# Patient Record
Sex: Male | Born: 2000 | Hispanic: Yes | Marital: Single | Smoking: Never smoker
Health system: Southern US, Community
[De-identification: ages and names within clinical notes are randomized; demographics above are authoritative.]

## PROBLEM LIST (undated history)

## (undated) HISTORY — PX: APPENDECTOMY: SHX54

---
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
## 2023-01-29 ENCOUNTER — Emergency Department (HOSPITAL_BASED_OUTPATIENT_CLINIC_OR_DEPARTMENT_OTHER)
Admission: EM | Admit: 2023-01-29 | Discharge: 2023-01-29 | Disposition: A | Discharge status: Home / Self Care | Payer: Medicaid Other | Attending: Emergency Medicine | Admitting: Emergency Medicine

## 2023-01-29 ENCOUNTER — Encounter (HOSPITAL_BASED_OUTPATIENT_CLINIC_OR_DEPARTMENT_OTHER): Payer: Self-pay | Admitting: Emergency Medicine

## 2023-01-29 ENCOUNTER — Other Ambulatory Visit: Payer: Self-pay

## 2023-01-29 DIAGNOSIS — J45909 Unspecified asthma, uncomplicated: Secondary | ICD-10-CM | POA: Diagnosis not present

## 2023-01-29 DIAGNOSIS — N4889 Other specified disorders of penis: Secondary | ICD-10-CM | POA: Diagnosis present

## 2023-01-29 DIAGNOSIS — N481 Balanitis: Secondary | ICD-10-CM | POA: Diagnosis not present

## 2023-01-29 LAB — URINALYSIS, MICROSCOPIC (REFLEX)

## 2023-01-29 LAB — URINALYSIS, ROUTINE W REFLEX MICROSCOPIC
Bilirubin Urine: NEGATIVE
Glucose, UA: NEGATIVE mg/dL
Ketones, ur: NEGATIVE mg/dL
Leukocytes,Ua: NEGATIVE
Nitrite: NEGATIVE
Protein, ur: NEGATIVE mg/dL
Specific Gravity, Urine: >=1.03 (ref 1.005–1.030)
pH: 5.5 (ref 5.0–8.0)

## 2023-01-29 MED ORDER — HYDROCORTISONE 1 % EX CREA: TOPICAL_CREAM | CUTANEOUS | 0 refills | Status: AC

## 2023-01-29 NOTE — Discharge Instructions (Addendum)
Please take your medications as prescribed. Take tylenol/ibuprofen for pain. I recommend close follow-up with urology for reevaluation.  Please do not hesitate to return to emergency department if worrisome signs symptoms we discussed become apparent.

## 2023-01-29 NOTE — ED Provider Notes (Signed)
[place] EMERGENCY DEPARTMENT AT [facility] [place] Provider Note   CSN: [id number] Arrival date & time: 01/29/23  1053     History  No chief complaint on file.   Ethan Ware is a 22 y.o. male history of asthma presents today for evaluation of penile pain.  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  He denies any redness, itching.  States he is able to retract and return the foreskin to its normal position.  He denies any urinary symptoms, abnormal penile discharge, fever, nausea or vomiting.  Patient is sexually active.  HPI  Past Medical History:  Diagnosis Date   Asthma    Past Surgical History:  Procedure Laterality Date   APPENDECTOMY       Home Medications Prior to Admission medications   Medication Sig Start Date End Date Taking? Authorizing Provider  acetaminophen (TYLENOL) 100 MG/ML solution Take 200 mg by mouth every 4 (four) hours as needed. For pain and fever     [provider]  ibuprofen (ADVIL,MOTRIN) 100 MG/5ML suspension Take 200 mg by mouth every 6 (six) hours as needed. For pain and fever      [provider]  Pediatric Multivit-Minerals-C (GUMMI BEAR MULTIVITAMIN/MIN PO) Take 1 each by mouth daily.      [provider]  traMADol (ULTRAM) 50 MG tablet Take 1 tablet (50 mg total) by mouth every 6 (six) hours as needed. 09/27/21   Cristopher Peru, PA-C      Allergies    Penicillins    Review of Systems   Review of Systems  Physical Exam Updated Vital Signs BP (!) 121/98 (BP Location: Left Arm)   Pulse 72   Temp 98.3 F (36.8 C) (Oral)   Resp 16   Ht 5\' 6"  (1.676 m)   Wt 81.6 kg   SpO2 98%   BMI 29.05 kg/m  Physical Exam Vitals and nursing note reviewed.  Constitutional:      Appearance: Normal appearance.  HENT:     Head: Normocephalic and atraumatic.     Mouth/Throat:     Mouth: Mucous membranes are moist.  Eyes:      General: No scleral icterus. Cardiovascular:     Rate and Rhythm: Normal rate and regular rhythm.     Pulses: Normal pulses.     Heart sounds: Normal heart sounds.  Pulmonary:     Effort: Pulmonary effort is normal.     Breath sounds: Normal breath sounds.  Abdominal:     General: Abdomen is flat.     Palpations: Abdomen is soft.     Tenderness: There is no abdominal tenderness.  Genitourinary:    Comments: GU exam with Greg NT present throughout examination.  Foreskin is able to be retracted and returned to its normal position.  Noted some swelling to the vein at the tip of the penis.  No overlying skin erythema.  No evidence of fungal infection. Musculoskeletal:        General: No deformity.  Skin:    General: Skin is warm.     Findings: No rash.  Neurological:     General: No focal deficit present.     Mental Status: He is alert.  Psychiatric:        Mood and Affect: Mood normal.    ED Results / Procedures / Treatments   Labs (all labs ordered are listed, but only abnormal results  are displayed) Labs Reviewed  URINALYSIS, ROUTINE W REFLEX MICROSCOPIC - Abnormal; Notable for the following components:      Result Value   Hgb urine dipstick TRACE (*)    All other components within normal limits  URINALYSIS, MICROSCOPIC (REFLEX) - Abnormal; Notable for the following components:   Bacteria, UA RARE (*)    All other components within normal limits    EKG None  Radiology No results found.  Procedures Procedures    Medications Ordered in ED Medications - No data to display  ED Course/ Medical Decision Making/ A&P                                 Medical Decision Making Amount and/or Complexity of Data Reviewed Labs: ordered.   This patient presents to the ED for penile pain, this involves an extensive number of treatment options, and is a complaint that carries with a high risk of complications and morbidity.  The differential diagnosis includes pharyngitis,  phimosis, paraphimosis, fungal infection, bacterial infection, UTI, STI.  This is not an exhaustive list.  Lab tests: I ordered and personally interpreted labs.  The pertinent results include: UA is unremarkable.  Problem list/ ED course/ Critical interventions/ Medical management: HPI: See above Vital signs within normal range and stable throughout visit. Laboratory/imaging studies significant for: See above. On physical examination, patient is afebrile and appears in no acute distress.  GU exam with Tammy Sours NT present throughout examination.  Foreskin is able to be retracted and returned is normal position.  I noticed some swelling to the vein at the tip of penis, no overlying skin erythema, no evidence of fungal or bacterial infection.  Symptoms most consistent with balanitis.  Based on physical examination, I have low suspicion for phimosis or paraphimosis.  Will prescribe hydrocortisone cream to help reduce swelling.  Advised patient to follow-up with urology for reevaluation and management.  Strict precaution discussed. I have reviewed the patient home medicines and have made adjustments as needed.  Cardiac monitoring/EKG: The patient was maintained on a cardiac monitor.  I personally reviewed and interpreted the cardiac monitor which showed an underlying rhythm of: sinus rhythm.  Additional history obtained: External records from outside source obtained and reviewed including: Chart review including previous notes, labs, imaging.  Consultations obtained:  Disposition Continued outpatient therapy. Follow-up with PCP recommended for reevaluation of symptoms. Treatment plan discussed with patient.  Pt acknowledged understanding was agreeable to the plan. Worrisome signs and symptoms were discussed with patient, and patient acknowledged understanding to return to the ED if they noticed these signs and symptoms. Patient was stable upon discharge.   This chart was dictated using voice  recognition software.  Despite best efforts to proofread,  errors can occur which can change the documentation meaning.          Final Clinical Impression(s) / ED Diagnoses Final diagnoses:  Penile pain  Balanitis    Rx / DC Orders ED Discharge Orders          Ordered    hydrocortisone cream 1 %        01/29/23 1201              Jeanelle Malling, [place] 01/29/23 1207    Rolan Bucco, MD 01/29/23 1520

## 2023-01-29 NOTE — ED Triage Notes (Addendum)
Pt states is uncircumcised and has started to have pain around his foreskin when he has an errection , x 3 months or so states that  it helps when he takes warm baths and stretches it states has noticed his stream has not been as  good
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
